# Patient Record
Sex: Male | Born: 1985 | Hispanic: Yes | Marital: Single
Health system: Southern US, Community
[De-identification: ages and names within clinical notes are randomized; demographics above are authoritative.]

---

## 2013-11-13 ENCOUNTER — Emergency Department (HOSPITAL_COMMUNITY): Payer: Self-pay

## 2013-11-13 ENCOUNTER — Emergency Department (HOSPITAL_COMMUNITY)
Admission: EM | Admit: 2013-11-13 | Discharge: 2013-11-13 | Disposition: A | Discharge status: Home / Self Care | Payer: Self-pay | Attending: Emergency Medicine | Admitting: Emergency Medicine

## 2013-11-13 DIAGNOSIS — S20219A Contusion of unspecified front wall of thorax, initial encounter: Secondary | ICD-10-CM | POA: Insufficient documentation

## 2013-11-13 DIAGNOSIS — S298XXA Other specified injuries of thorax, initial encounter: Secondary | ICD-10-CM | POA: Insufficient documentation

## 2013-11-13 DIAGNOSIS — S1093XA Contusion of unspecified part of neck, initial encounter: Secondary | ICD-10-CM

## 2013-11-13 DIAGNOSIS — F1010 Alcohol abuse, uncomplicated: Secondary | ICD-10-CM | POA: Insufficient documentation

## 2013-11-13 DIAGNOSIS — S0993XA Unspecified injury of face, initial encounter: Secondary | ICD-10-CM | POA: Insufficient documentation

## 2013-11-13 DIAGNOSIS — S199XXA Unspecified injury of neck, initial encounter: Secondary | ICD-10-CM

## 2013-11-13 DIAGNOSIS — S0512XA Contusion of eyeball and orbital tissues, left eye, initial encounter: Secondary | ICD-10-CM

## 2013-11-13 DIAGNOSIS — Y92410 Unspecified street and highway as the place of occurrence of the external cause: Secondary | ICD-10-CM | POA: Insufficient documentation

## 2013-11-13 DIAGNOSIS — S0083XA Contusion of other part of head, initial encounter: Secondary | ICD-10-CM | POA: Insufficient documentation

## 2013-11-13 DIAGNOSIS — S0003XA Contusion of scalp, initial encounter: Secondary | ICD-10-CM | POA: Insufficient documentation

## 2013-11-13 DIAGNOSIS — Y9389 Activity, other specified: Secondary | ICD-10-CM | POA: Insufficient documentation

## 2013-11-13 DIAGNOSIS — F10929 Alcohol use, unspecified with intoxication, unspecified: Secondary | ICD-10-CM

## 2013-11-13 LAB — RAPID URINE DRUG SCREEN, HOSP PERFORMED
Amphetamines: NOT DETECTED
BENZODIAZEPINES: NOT DETECTED
Barbiturates: NOT DETECTED
Cocaine: NOT DETECTED
Opiates: NOT DETECTED
Tetrahydrocannabinol: NOT DETECTED

## 2013-11-13 LAB — CBC WITH DIFFERENTIAL/PLATELET
Basophils Absolute: 0 10*3/uL (ref 0.0–0.1)
Basophils Relative: 0 % (ref 0–1)
EOS ABS: 0.3 10*3/uL (ref 0.0–0.7)
Eosinophils Relative: 4 % (ref 0–5)
HEMATOCRIT: 45.2 % (ref 39.0–52.0)
Hemoglobin: 16.7 g/dL (ref 13.0–17.0)
LYMPHS PCT: 30 % (ref 12–46)
Lymphs Abs: 2.1 10*3/uL (ref 0.7–4.0)
MCH: 32.3 pg (ref 26.0–34.0)
MCHC: 36.9 g/dL — ABNORMAL HIGH (ref 30.0–36.0)
MCV: 87.4 fL (ref 78.0–100.0)
Monocytes Absolute: 0.6 10*3/uL (ref 0.1–1.0)
Monocytes Relative: 8 % (ref 3–12)
NEUTROS ABS: 4.1 10*3/uL (ref 1.7–7.7)
NEUTROS PCT: 58 % (ref 43–77)
PLATELETS: 269 10*3/uL (ref 150–400)
RBC: 5.17 MIL/uL (ref 4.22–5.81)
RDW: 12 % (ref 11.5–15.5)
WBC: 7.1 10*3/uL (ref 4.0–10.5)

## 2013-11-13 LAB — COMPREHENSIVE METABOLIC PANEL
ALT: 269 U/L — ABNORMAL HIGH (ref 0–53)
ANION GAP: 17 — AB (ref 5–15)
AST: 212 U/L — AB (ref 0–37)
Albumin: 4.5 g/dL (ref 3.5–5.2)
Alkaline Phosphatase: 73 U/L (ref 39–117)
BUN: 8 mg/dL (ref 6–23)
CO2: 20 mEq/L (ref 19–32)
Calcium: 8.9 mg/dL (ref 8.4–10.5)
Chloride: 103 mEq/L (ref 96–112)
Creatinine, Ser: 0.74 mg/dL (ref 0.50–1.35)
GFR calc Af Amer: >90 mL/min (ref >90)
GFR calc non Af Amer: >90 mL/min (ref >90)
Glucose, Bld: 109 mg/dL — ABNORMAL HIGH (ref 70–99)
POTASSIUM: 3.4 meq/L — AB (ref 3.7–5.3)
Sodium: 140 mEq/L (ref 137–147)
TOTAL PROTEIN: 8.1 g/dL (ref 6.0–8.3)
Total Bilirubin: 0.3 mg/dL (ref 0.3–1.2)

## 2013-11-13 LAB — ETHANOL: ALCOHOL ETHYL (B): 260 mg/dL — AB (ref 0–11)

## 2013-11-13 MED ORDER — HYDROCODONE-ACETAMINOPHEN 5-325 MG PO TABS: 1.0000 | ORAL_TABLET | Freq: Four times a day (QID) | ORAL | Status: AC | PRN

## 2013-11-13 MED ORDER — IBUPROFEN 600 MG PO TABS: 600.0000 mg | ORAL_TABLET | Freq: Four times a day (QID) | ORAL | Status: AC | PRN
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
